# Patient Record
Sex: Female | Born: 1995
Health system: Southern US, Community
[De-identification: ages and names within clinical notes are randomized; demographics above are authoritative.]

## PROBLEM LIST (undated history)

## (undated) DIAGNOSIS — N912 Amenorrhea, unspecified: Secondary | ICD-10-CM

## (undated) HISTORY — DX: Amenorrhea, unspecified: N91.2

---

## 2017-01-27 ENCOUNTER — Telehealth: Payer: Self-pay | Admitting: Family Medicine

## 2017-01-27 ENCOUNTER — Encounter: Payer: Self-pay | Admitting: Family Medicine

## 2017-01-27 ENCOUNTER — Ambulatory Visit (INDEPENDENT_AMBULATORY_CARE_PROVIDER_SITE_OTHER): Payer: Medicaid Other | Admitting: Family Medicine

## 2017-01-27 VITALS — BP 124/86 | HR 82 | Temp 98.9°F | Resp 14 | Ht 65.0 in | Wt 203.0 lb

## 2017-01-27 DIAGNOSIS — Z23 Encounter for immunization: Secondary | ICD-10-CM

## 2017-01-27 DIAGNOSIS — Z7689 Persons encountering health services in other specified circumstances: Secondary | ICD-10-CM | POA: Diagnosis not present

## 2017-01-27 DIAGNOSIS — N912 Amenorrhea, unspecified: Secondary | ICD-10-CM | POA: Diagnosis not present

## 2017-01-27 DIAGNOSIS — Z131 Encounter for screening for diabetes mellitus: Secondary | ICD-10-CM | POA: Diagnosis not present

## 2017-01-27 DIAGNOSIS — Z1389 Encounter for screening for other disorder: Secondary | ICD-10-CM

## 2017-01-27 DIAGNOSIS — N83202 Unspecified ovarian cyst, left side: Secondary | ICD-10-CM | POA: Diagnosis not present

## 2017-01-27 LAB — POCT URINALYSIS DIP (DEVICE)
BILIRUBIN URINE: NEGATIVE
Glucose, UA: NEGATIVE mg/dL
HGB URINE DIPSTICK: NEGATIVE
KETONES UR: NEGATIVE mg/dL
Leukocytes, UA: NEGATIVE
NITRITE: NEGATIVE
PH: 6.5 (ref 5.0–8.0)
PROTEIN: NEGATIVE mg/dL
Specific Gravity, Urine: <=1.005 (ref 1.005–1.030)
Urobilinogen, UA: 0.2 mg/dL (ref 0.0–1.0)

## 2017-01-27 LAB — POCT URINE PREGNANCY: Preg Test, Ur: NEGATIVE

## 2017-01-27 NOTE — Progress Notes (Signed)
Patient ID: Myishia Kasik, female    DOB: Jan 29, 1996, 21 y.o.   MRN: [id number]  PCP: Bing Neighbors, FNP  Chief Complaint  Patient presents with  . Establish Care    Subjective:  HPI Svara Twyman is a 21 y.o. female presents to establish care. Tongela is concerned as she has experienced amenorrhea since May 2018.  She reports that recently her sister was diagnosed with polycystic ovarian syndrome she is also concerned that she may also suffer from this condition.  Denies any recent sexual contact.  She reports consistently taking her oral contraceptive pills for all the placebo week she is not producing any bleeding.  She denies any associated pelvic cramping, premenstrual symptoms, spotting.  She has no prior diagnoses of type 2 diabetes or hyperinsulinemia. Current Body mass index is 33.78 kg/m.   Social History   Social History  . Marital status: Single    Spouse name: N/A  . Number of children: N/A  . Years of education: N/A   Occupational History  . Not on file.   Social History Main Topics  . Smoking status: Never Smoker  . Smokeless tobacco: Never Used  . Alcohol use No  . Drug use: No  . Sexual activity: Not on file   Other Topics Concern  . Not on file   Social History Narrative  . No narrative on file   Family History  Problem Relation Age of Onset  . Diabetes Father    Review of Systems  Constitutional: Negative.   HENT: Negative.   Eyes: Negative.   Respiratory: Negative.   Cardiovascular: Negative.   Gastrointestinal: Negative.   Endocrine: Negative.   Genitourinary: Positive for menstrual problem. Negative for difficulty urinating, dysuria, pelvic pain, urgency, vaginal bleeding, vaginal discharge and vaginal pain.  Neurological: Negative.   Hematological: Negative.   Psychiatric/Behavioral: Negative.       Allergies not on file  Prior to Admission medications   Medication Sig Start Date End Date Taking? Authorizing Provider   Norethindrone Acetate-Ethinyl Estrad-FE (BLISOVI 24 FE) 1-20 MG-MCG(24) tablet Take 1 tablet by mouth daily.   Yes [provider]    Past Medical, Surgical Family and Social History reviewed and updated.    Objective:   Today's Vitals   01/27/17 0917  BP: 124/86  Pulse: 82  Resp: 14  Temp: 98.9 F (37.2 C)  TempSrc: Oral  SpO2: 99%  Weight: 203 lb (92.1 kg)  Height: 5\' 5"  (1.651 m)    Wt Readings from Last 3 Encounters:  01/27/17 203 lb (92.1 kg) (98 %, Z= 1.99)*   * Growth percentiles are based on CDC 2-20 Years data.    Physical Exam  Constitutional: She is oriented to person, place, and time. She appears well-developed and well-nourished.  HENT:  Head: Normocephalic and atraumatic.  Eyes: Pupils are equal, round, and reactive to light. Conjunctivae are normal.  Neck: Normal range of motion. Neck supple. No thyromegaly present.  Cardiovascular: Normal rate, regular rhythm, normal heart sounds and intact distal pulses.   Pulmonary/Chest: Effort normal and breath sounds normal.  Abdominal: Soft. Bowel sounds are normal. She exhibits no distension and no mass. There is no tenderness. There is no rebound and no guarding.  Musculoskeletal: Normal range of motion.  Lymphadenopathy:    She has no cervical adenopathy.  Neurological: She is alert and oriented to person, place, and time.  Skin: Skin is warm and dry.  Psychiatric: She has a normal mood and affect. Her behavior  is normal. Judgment and thought content normal.   Assessment & Plan:  1. Encounter to establish care 2. Amenorrhea, negative pregnancy. Checking prolactin, FSH/LH, testosterone, estradiol, and A1C.  If labs are normal, will consider intravaginal ultrasound and referral GYN. 3. Screening for blood or protein in urine, UA negative  4. Screening for diabetes mellitus- Hemoglobin A1c 5. Need for immunization against influenza- Flu Vaccine QUAD 36+ mos IM 6. Need for Tdap vaccination- Tdap vaccine  greater than or equal to 7yo IM   Orders Placed This Encounter  Procedures  . US Transvaginal Non-OB  . US Pelvis Complete  . Flu Vaccine QUAD 36+ mos IM  . Tdap vaccine greater than or equal to 7yo IM  . CBC with Differential  . Testosterone  . FSH/LH  . Prolactin  . Estradiol  . Hemoglobin A1c  . POCT urine pregnancy  . POCT urinalysis dip (device)    Return to care in 3 months.    Godfrey PickKimberly S. Tiburcio PeaHarris, MSN, FNP-C [facility]  [address] [place], [place] [address] [phone]

## 2017-01-27 NOTE — Telephone Encounter (Signed)
Carrie,  Please schedule non-ob transvaginal US and US pelvis complete.  Godfrey PickKimberly S. Tiburcio PeaHarris, MSN, FNP-C [facility]  [address] [place], [place] [address] [phone]

## 2017-01-28 LAB — CBC WITH DIFFERENTIAL/PLATELET
BASOS ABS: 41 {cells}/uL (ref 0–200)
Basophils Relative: 0.6 %
EOS ABS: 109 {cells}/uL (ref 15–500)
Eosinophils Relative: 1.6 %
HEMATOCRIT: 39.2 % (ref 35.0–45.0)
Hemoglobin: 13.3 g/dL (ref 11.7–15.5)
LYMPHS ABS: 2380 {cells}/uL (ref 850–3900)
MCH: 28.8 pg (ref 27.0–33.0)
MCHC: 33.9 g/dL (ref 32.0–36.0)
MCV: 84.8 fL (ref 80.0–100.0)
MPV: 9.8 fL (ref 7.5–12.5)
Monocytes Relative: 5.3 %
NEUTROS PCT: 57.5 %
Neutro Abs: 3910 cells/uL (ref 1500–7800)
Platelets: 249 10*3/uL (ref 140–400)
RBC: 4.62 10*6/uL (ref 3.80–5.10)
RDW: 12.4 % (ref 11.0–15.0)
TOTAL LYMPHOCYTE: 35 %
WBC: 6.8 10*3/uL (ref 3.8–10.8)
WBCMIX: 360 {cells}/uL (ref 200–950)

## 2017-01-28 LAB — ESTRADIOL: ESTRADIOL: 225 pg/mL

## 2017-01-28 LAB — FSH/LH
FSH: 2.9 m[IU]/mL
LH: 2.7 m[IU]/mL

## 2017-01-28 LAB — PROLACTIN: PROLACTIN: 25.1 ng/mL

## 2017-01-28 LAB — HEMOGLOBIN A1C
EAG (MMOL/L): 5.5 (calc)
HEMOGLOBIN A1C: 5.1 %{Hb} (ref <5.7)
MEAN PLASMA GLUCOSE: 100 (calc)

## 2017-01-28 LAB — TESTOSTERONE: Testosterone: 51 ng/dL

## 2017-01-30 ENCOUNTER — Encounter: Payer: Self-pay | Admitting: Family Medicine

## 2017-01-30 ENCOUNTER — Other Ambulatory Visit: Payer: Self-pay | Admitting: Family Medicine

## 2017-01-30 MED ORDER — NORETHIN ACE-ETH ESTRAD-FE 1-20 MG-MCG(24) PO TABS: 1.0000 | ORAL_TABLET | Freq: Every day | ORAL | 5 refills | Status: DC

## 2017-01-30 NOTE — Progress Notes (Signed)
Refilled oral contraceptive. Recent pregnancy test in office negative.  Godfrey PickKimberly S. Tiburcio PeaHarris, MSN, FNP-C [facility]  [address] [place], [place] [address] [phone]

## 2017-01-30 NOTE — Telephone Encounter (Signed)
Patient scheduled for 02/02/2017 at 1:15pm. Patient mom was notified of appointment and will make sure patient makes it.

## 2017-02-02 ENCOUNTER — Ambulatory Visit (HOSPITAL_COMMUNITY)
Admission: RE | Admit: 2017-02-02 | Discharge: 2017-02-02 | Disposition: A | Discharge status: Home / Self Care | Payer: Medicaid Other | Source: Ambulatory Visit | Attending: Family Medicine | Admitting: Family Medicine

## 2017-02-02 DIAGNOSIS — N83202 Unspecified ovarian cyst, left side: Secondary | ICD-10-CM | POA: Insufficient documentation

## 2017-02-02 DIAGNOSIS — N912 Amenorrhea, unspecified: Secondary | ICD-10-CM | POA: Diagnosis present

## 2017-02-03 ENCOUNTER — Encounter: Payer: Self-pay | Admitting: Family Medicine

## 2017-02-03 NOTE — Addendum Note (Signed)
Addended by: Bing NeighborsHARRIS, Kavonte Bearse S on: 02/03/2017 12:09 PM   Modules accepted: Orders

## 2017-02-07 ENCOUNTER — Encounter: Payer: Self-pay | Admitting: Family Medicine

## 2017-04-16 ENCOUNTER — Encounter: Payer: Self-pay | Admitting: Family Medicine

## 2017-05-01 ENCOUNTER — Encounter: Payer: Self-pay | Admitting: Family Medicine

## 2017-05-01 ENCOUNTER — Ambulatory Visit (INDEPENDENT_AMBULATORY_CARE_PROVIDER_SITE_OTHER): Payer: Medicaid Other | Admitting: Family Medicine

## 2017-05-01 ENCOUNTER — Ambulatory Visit: Payer: Medicaid Other | Admitting: Family Medicine

## 2017-05-01 VITALS — BP 122/80

## 2017-05-01 DIAGNOSIS — N912 Amenorrhea, unspecified: Secondary | ICD-10-CM | POA: Diagnosis not present

## 2017-05-01 LAB — POCT URINE PREGNANCY: PREG TEST UR: NEGATIVE

## 2017-05-22 ENCOUNTER — Ambulatory Visit: Payer: Medicaid Other | Admitting: Family Medicine

## 2017-05-22 ENCOUNTER — Encounter: Payer: Self-pay | Admitting: Family Medicine

## 2017-05-22 VITALS — BP 130/68 | HR 94 | Temp 98.9°F | Resp 16 | Ht 65.0 in | Wt 206.0 lb

## 2017-05-22 DIAGNOSIS — Z3200 Encounter for pregnancy test, result unknown: Secondary | ICD-10-CM | POA: Diagnosis not present

## 2017-05-22 DIAGNOSIS — N912 Amenorrhea, unspecified: Secondary | ICD-10-CM | POA: Diagnosis not present

## 2017-05-22 DIAGNOSIS — Z6836 Body mass index (BMI) 36.0-36.9, adult: Secondary | ICD-10-CM | POA: Diagnosis not present

## 2017-05-22 LAB — POCT URINALYSIS DIP (DEVICE)
BILIRUBIN URINE: NEGATIVE
Glucose, UA: NEGATIVE mg/dL
KETONES UR: NEGATIVE mg/dL
Leukocytes, UA: NEGATIVE
Nitrite: NEGATIVE
PH: 5.5 (ref 5.0–8.0)
Protein, ur: NEGATIVE mg/dL
Specific Gravity, Urine: 1.025 (ref 1.005–1.030)
Urobilinogen, UA: 0.2 mg/dL (ref 0.0–1.0)

## 2017-05-22 LAB — POCT URINE PREGNANCY: PREG TEST UR: NEGATIVE

## 2017-05-22 MED ORDER — NORETHIN ACE-ETH ESTRAD-FE 1-20 MG-MCG(24) PO TABS: 1.0000 | ORAL_TABLET | Freq: Every day | ORAL | 11 refills | Status: DC

## 2017-05-22 MED ORDER — NORETHIN ACE-ETH ESTRAD-FE 1-20 MG-MCG(24) PO TABS: 1.0000 | ORAL_TABLET | Freq: Every day | ORAL | 5 refills | Status: DC

## 2017-05-22 NOTE — Progress Notes (Signed)
Patient ID: Destiny Barrericka Grave, female    DOB: [birth year], 22 y.o.   MRN: [id number]  PCP: Bing NeighborsHarris, Rydell Wiegel S, FNP  Chief Complaint  Patient presents with  . Follow-up    3 month on rountine care    Subjective:  HPI Destiny Bowers is a 22 y.o. female with a history of obesity, amenorrhea, presents for follow-up of prior visits. During Gianelle last visit, she was concerned regarding the sudden cessation of her menstrual cycle. Pelvic and transvaginal ultrasound were both negative. She was referred to Dr. Derrek Guouglas Louk for a second opinion at [facility], in which he felt amenorrhea was secondary to oral contraceptive medication, no additional work-up was warranted. She continues not to have regular menses and recent pregnancy test was negative. She is continuing to make efforts to loose weight by engaging in routine physical activity. Current Body mass index is 34.28 kg/m. Social History   Socioeconomic History  . Marital status: Single    Spouse name: Not on file  . Number of children: Not on file  . Years of education: Not on file  . Highest education level: Not on file  Social Needs  . Financial resource strain: Not on file  . Food insecurity - worry: Not on file  . Food insecurity - inability: Not on file  . Transportation needs - medical: Not on file  . Transportation needs - non-medical: Not on file  Occupational History  . Not on file  Tobacco Use  . Smoking status: Never Smoker  . Smokeless tobacco: Never Used  Substance and Sexual Activity  . Alcohol use: No  . Drug use: No  . Sexual activity: Not on file  Other Topics Concern  . Not on file  Social History Narrative  . Not on file    Family History  Problem Relation Age of Onset  . Diabetes Father    Review of Systems Pertinent  negatives indicated in HPI Patient Active Problem List   Diagnosis Date Noted  . Amenorrhea 01/27/2017    Allergies not on file  Prior to Admission medications    Medication Sig Start Date End Date Taking? Authorizing Provider  Norethindrone Acetate-Ethinyl Estrad-FE (BLISOVI 24 FE) 1-20 MG-MCG(24) tablet Take 1 tablet by mouth daily. 01/30/17   Bing NeighborsHarris, Arlethia Basso S, FNP    Past Medical, Surgical Family and Social History reviewed and updated.    Objective:   Today's Vitals   05/22/17 1039  BP: 130/68  Pulse: 94  Resp: 16  Temp: 98.9 F (37.2 C)  TempSrc: Oral  SpO2: 100%  Weight: 206 lb (93.4 kg)  Height: 5\' 5"  (1.651 m)    Wt Readings from Last 3 Encounters:  05/22/17 206 lb (93.4 kg) (98 %, Z= 2.03)*  01/27/17 203 lb (92.1 kg) (98 %, Z= 1.99)*   * Growth percentiles are based on CDC (Girls, 2-20 Years) data.    Physical Exam Constitutional: Patient appears well-developed and well-nourished. No distress. HENT: Normocephalic, atraumatic, External right and left ear normal. Oropharynx is clear and moist.  Eyes: Conjunctivae and EOM are normal. PERRLA, no scleral icterus. Neck: Normal ROM. Neck supple. No JVD. No tracheal deviation. No thyromegaly. CVS: RRR, S1/S2 +, no murmurs, no gallops, no carotid bruit.  Pulmonary: Effort and breath sounds normal, no stridor, rhonchi, wheezes, rales.  Abdominal: Soft. BS +, no distension, tenderness, rebound or guarding.  Musculoskeletal: Normal range of motion. No edema and no tenderness.  Lymphadenopathy: No lymphadenopathy noted, cervical, inguinal or axillary Neuro: Alert.  Normal reflexes, muscle tone coordination. No cranial nerve deficit. Skin: Skin is warm and dry. No rash noted. Not diaphoretic. No erythema. No pallor. Psychiatric: Normal mood and affect. Behavior, judgment, thought content normal.   Assessment & Plan:  1. Amenorrhea, likely secondary to chronic oral contraceptive use. She has followed-up with GYN, who recommends continue oral contraception and to follow-up as needed.  2. Encounter for pregnancy test, result unknown, negative   3. Body mass index (BMI) of 36.0-36.9 in  adult Goal BMI  <25. Encouraged efforts to reduce weight include engaging in physical activity as tolerated with goal of 150 minutes per week. Improve dietary choices and eat a meal regimen consistent with a Mediterranean or DASH diet. Reduce simple carbohydrates. Do not skip meals and eat healthy snacks throughout the day to avoid over-eating at dinner. Set a goal weight loss that is achievable for you.  Meds ordered this encounter  Medications  . DISCONTD: Norethindrone Acetate-Ethinyl Estrad-FE (BLISOVI 24 FE) 1-20 MG-MCG(24) tablet    Sig: Take 1 tablet by mouth daily.    Dispense:  1 Package    Refill:  5    Order Specific Question:   Supervising Provider    Answer:   Quentin Angst [id number]  . Norethindrone Acetate-Ethinyl Estrad-FE (BLISOVI 24 FE) 1-20 MG-MCG(24) tablet    Sig: Take 1 tablet by mouth daily.    Dispense:  1 Package    Refill:  11    Order Specific Question:   Supervising Provider    Answer:   Quentin Angst [id number]    Orders Placed This Encounter  Procedures  . POCT urinalysis dip (device)  . POCT urine pregnancy    RTC: 1 year for annual wellness exam.   Godfrey Pick. Tiburcio Pea, MSN, FNP-C [facility]  [address] [place], [place] [address] [phone]

## 2017-05-31 DIAGNOSIS — Z6836 Body mass index (BMI) 36.0-36.9, adult: Secondary | ICD-10-CM | POA: Insufficient documentation

## 2017-12-14 ENCOUNTER — Ambulatory Visit (INDEPENDENT_AMBULATORY_CARE_PROVIDER_SITE_OTHER): Payer: No Typology Code available for payment source

## 2017-12-14 DIAGNOSIS — Z23 Encounter for immunization: Secondary | ICD-10-CM

## 2017-12-18 IMAGING — US US TRANSVAGINAL NON-OB
1 series · 13 of 25 positions shown · non-contrast
Comparison: None

CLINICAL DATA: Initial evaluation for amenorrhea.

EXAM:
TRANSABDOMINAL AND TRANSVAGINAL ULTRASOUND OF PELVIS
TECHNIQUE: Both transabdominal and transvaginal ultrasound examinations of the
pelvis were performed. Transabdominal technique was performed for
global imaging of the pelvis including uterus, ovaries, adnexal
regions, and pelvic cul-de-sac. It was necessary to proceed with
endovaginal exam following the transabdominal exam to visualize the
uterus and ovaries.

[Series 1: us transvaginal non-ob · 0.22mm/px · 13 of 98 slices shown]
[im 1/98]
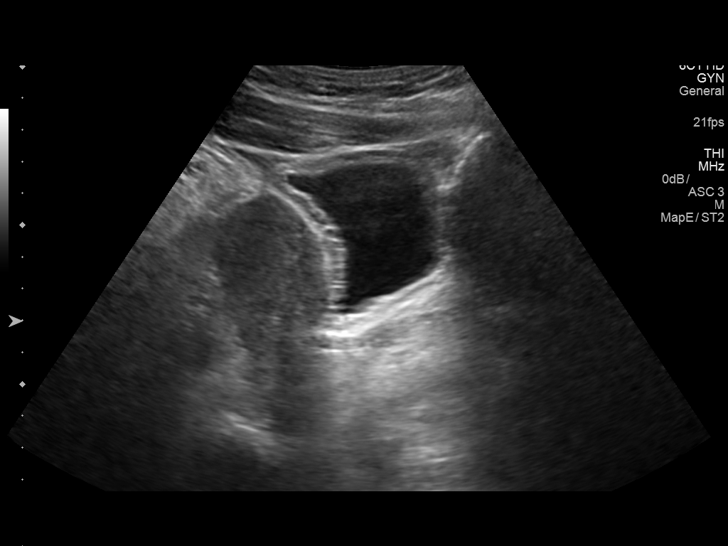
[im 9/98]
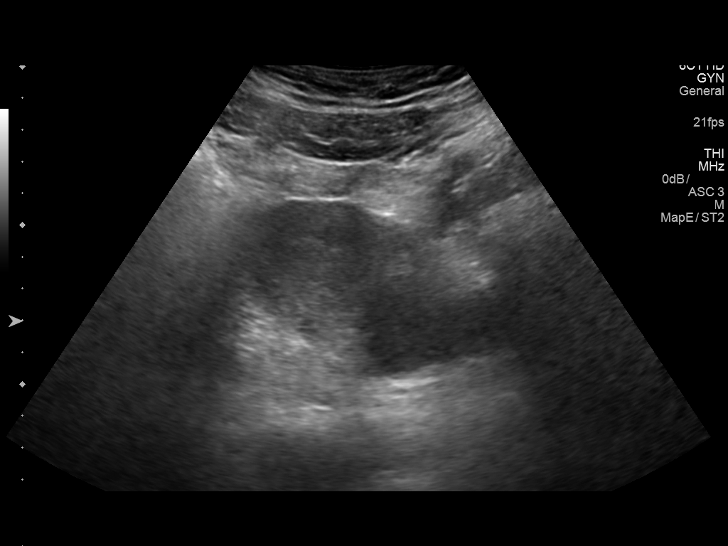
[im 17/98]
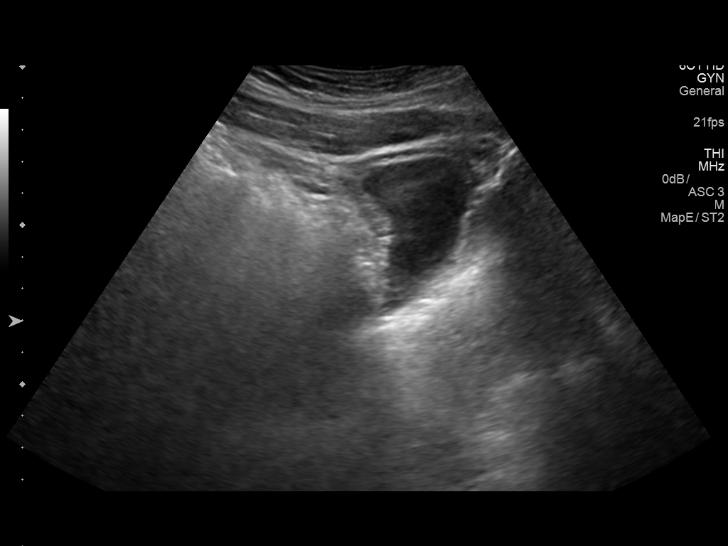
[im 25/98]
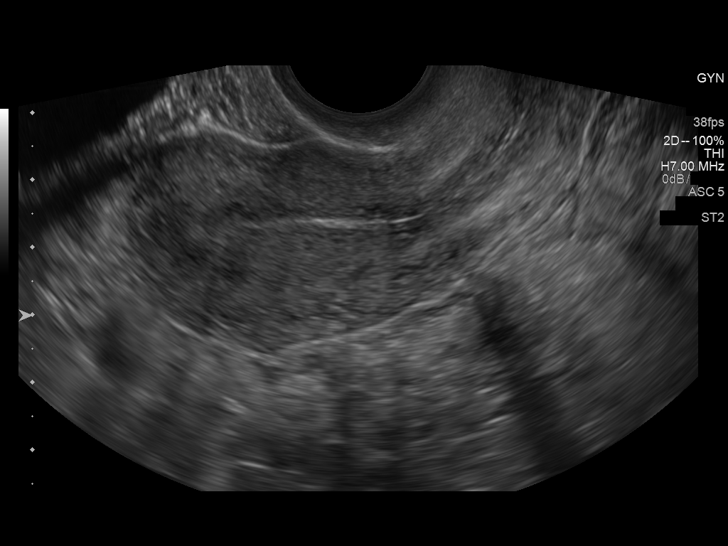
[im 33/98]
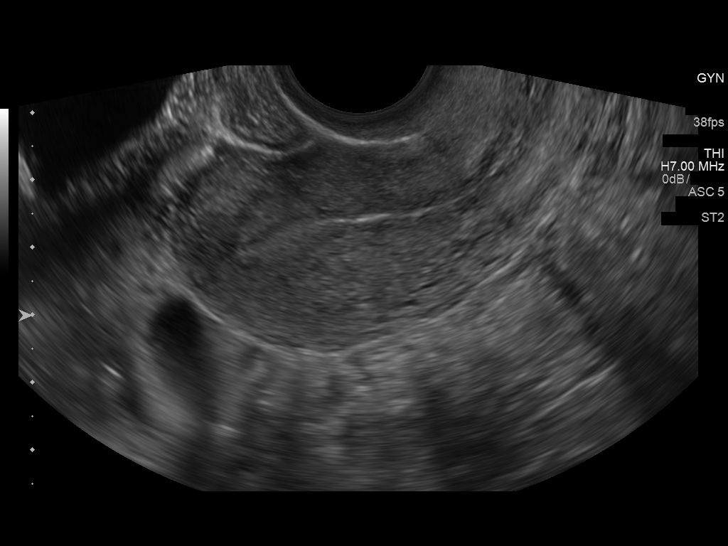
[im 41/98]
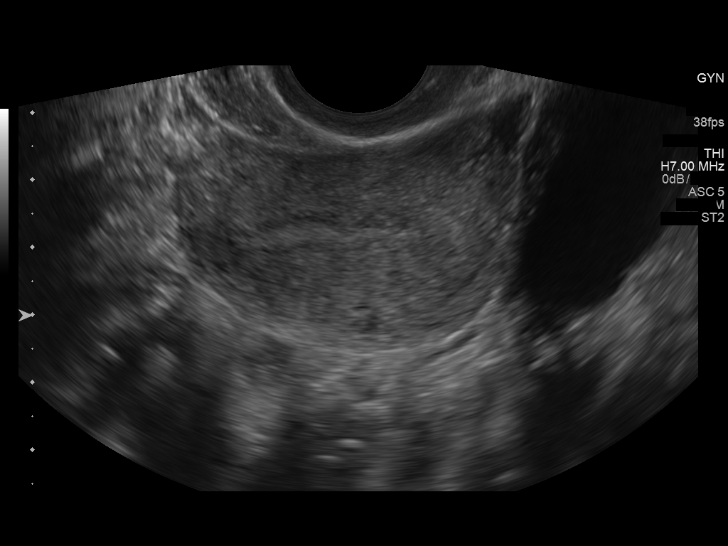
[im 49/98]
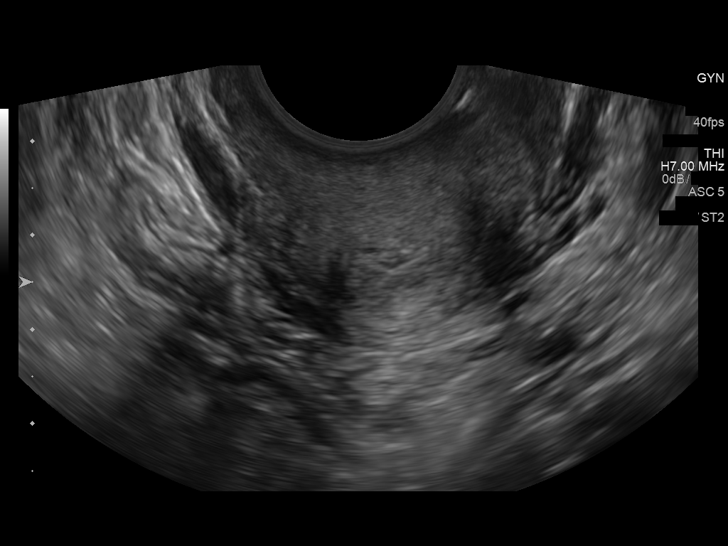
[im 57/98]
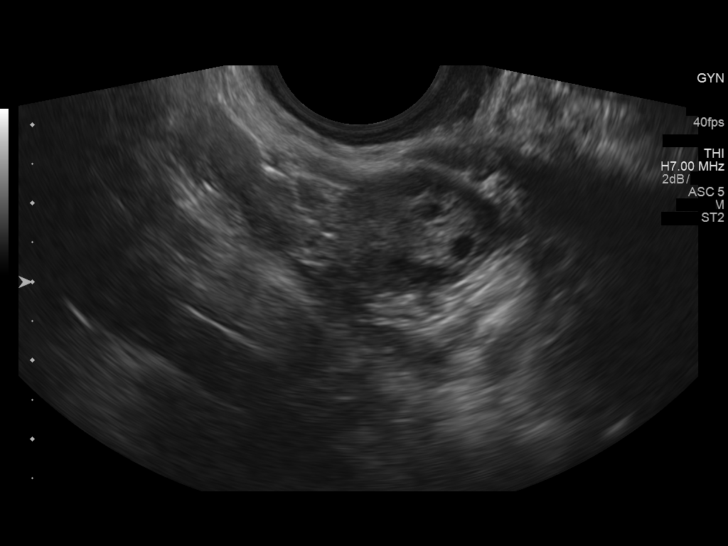
[im 65/98]
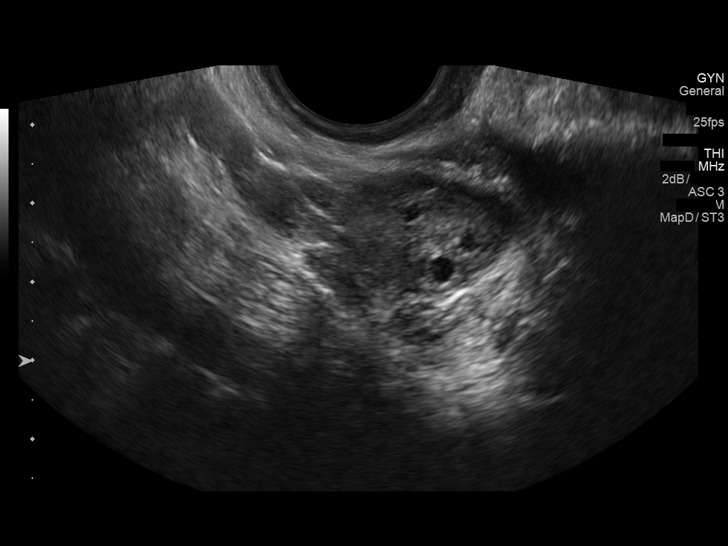
[im 73/98]
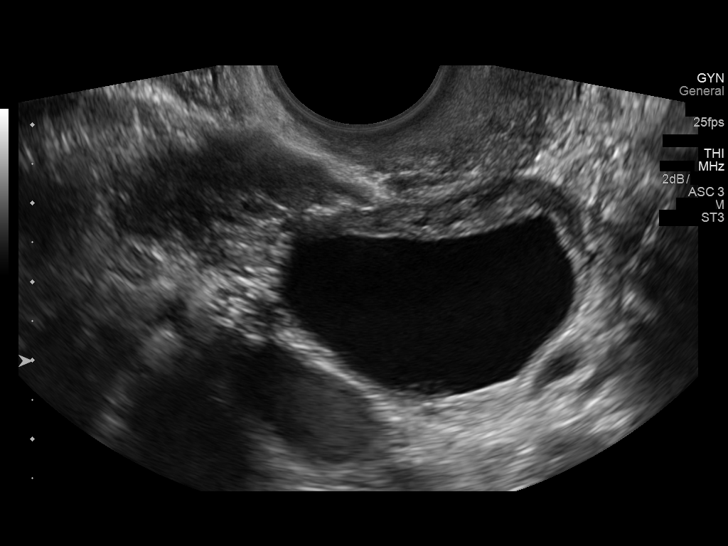
[im 81/98]
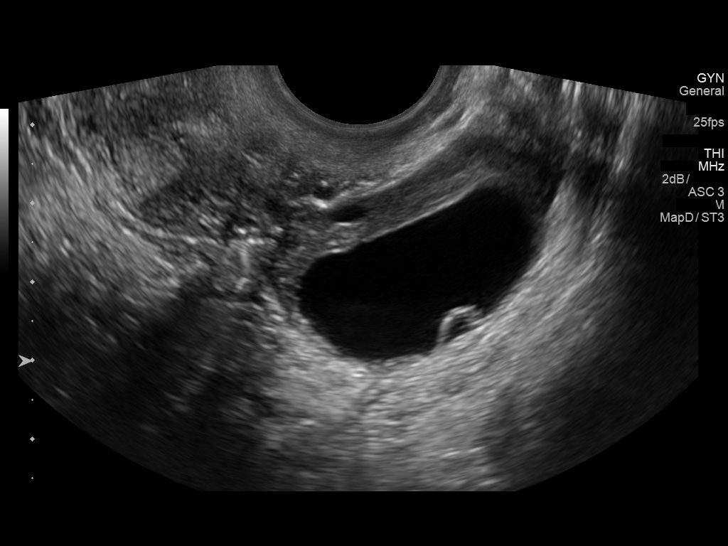
[im 89/98]
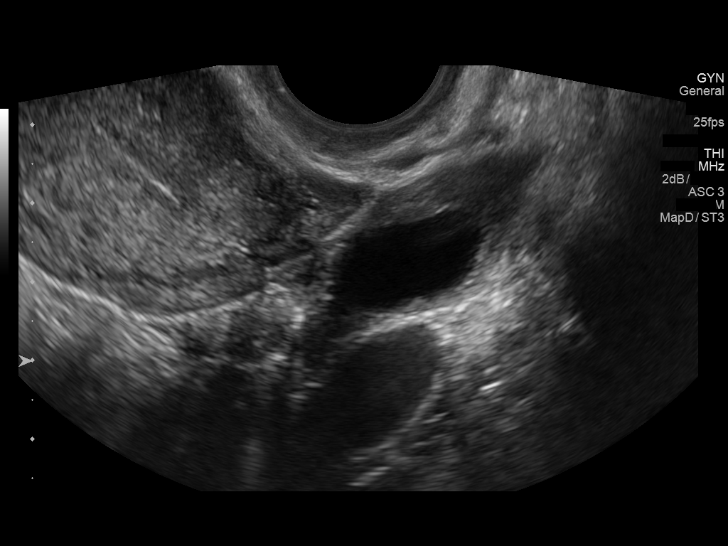
[im 98/98]
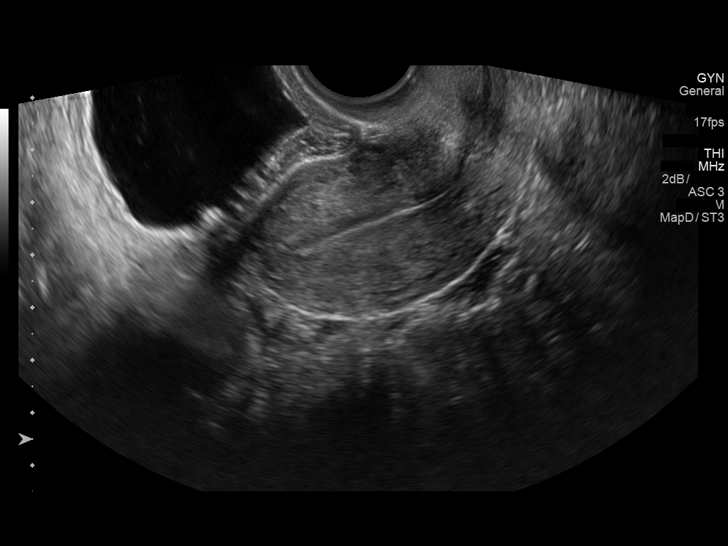

[13 of 25 positions shown; findings below may reference images not displayed]

FINDINGS: Uterus

Measurements: 7.4 x 3.4 x 4.6 cm. No fibroids or other mass
visualized.

Endometrium

Thickness: 3.1 mm.  No focal abnormality visualized.

Right ovary

Measurements: 2.2 x 1.8 x 1.6 cm. Normal appearance/no adnexal mass.

Left ovary

Measurements: 4.4 x 3.0 x 2.4 cm. 3.7 x 2.0 x 2.2 cm simple anechoic
cyst, most consistent with a normal physiologic cyst. Probable small
internal daughter cysts noted. No complex or worrisome features
identified.

Other findings

No abnormal free fluid.
IMPRESSION: 1. 3.7 cm left ovarian cyst, most consistent with a normal
physiologic cyst. Based on age and appearance of this lesion, no
follow-up imaging is recommended regarding this finding.
2. Otherwise unremarkable pelvic ultrasound.

## 2018-06-27 ENCOUNTER — Encounter: Payer: Self-pay | Admitting: Family Medicine

## 2018-07-03 ENCOUNTER — Telehealth: Payer: Self-pay | Admitting: Family Medicine

## 2018-07-03 NOTE — Telephone Encounter (Signed)
Copied from CRM [id number]. Topic: General - Other >> Jul 03, 2018  9:42 AM Leafy Ro wrote: Reason for CRM: [facility] left voice message on refill line. Pt needs a refill on blisovi 24 FE.  [facility] [place]

## 2018-07-03 NOTE — Telephone Encounter (Signed)
Not our pt call pt and schedule an appointment.

## 2018-07-04 ENCOUNTER — Telehealth: Payer: Self-pay | Admitting: Family Medicine

## 2018-07-04 NOTE — Telephone Encounter (Signed)
Patient scheduled for follow up appointment at our office on 06/26/2018 for Telephone Virtual Appointment.

## 2018-07-06 ENCOUNTER — Encounter: Payer: Self-pay | Admitting: Family Medicine

## 2018-07-06 ENCOUNTER — Ambulatory Visit (INDEPENDENT_AMBULATORY_CARE_PROVIDER_SITE_OTHER): Payer: No Typology Code available for payment source | Admitting: Family Medicine

## 2018-07-06 ENCOUNTER — Ambulatory Visit: Payer: No Typology Code available for payment source | Admitting: Family Medicine

## 2018-07-06 ENCOUNTER — Other Ambulatory Visit: Payer: Self-pay

## 2018-07-06 DIAGNOSIS — N912 Amenorrhea, unspecified: Secondary | ICD-10-CM | POA: Diagnosis not present

## 2018-07-06 DIAGNOSIS — Z309 Encounter for contraceptive management, unspecified: Secondary | ICD-10-CM | POA: Insufficient documentation

## 2018-07-06 MED ORDER — NORETHIN ACE-ETH ESTRAD-FE 1-20 MG-MCG(24) PO TABS: 1.0000 | ORAL_TABLET | Freq: Every day | ORAL | 3 refills | Status: DC

## 2018-07-06 NOTE — Progress Notes (Signed)
Virtual Visit via Telephone Note--Re-Establish Care I connected with Destiny Bowers on 07/06/18 at  8:00 AM EDT by telephone and verified that I am speaking with the correct person using two identifiers.   I discussed the limitations, risks, security and privacy concerns of performing an evaluation and management service by telephone and the availability of in person appointments. I also discussed with the patient that there may be a patient responsible charge related to this service. The patient expressed understanding and agreed to proceed.  History of Present Illness:  Past Medical History:  Diagnosis Date  . Amenorrhea     No outpatient medications have been marked as taking for the 07/06/18 encounter (Office Visit) with Kallie Locks, FNP.   Current Status: Since her last office visit, she is doing well with no complaints. She has been lost to follow up. Her last Pap Smear was 1 year ago. She has followed up with GYN for Amenorrhea, with break through bleeding. She continues birth control regularly.    She denies fevers, chills, fatigue, recent infections, weight loss, and night sweats. She has not had any headaches, visual changes, dizziness, and falls. No chest pain, heart palpitations, cough and shortness of breath reported. No reports of GI problems such as nausea, vomiting, diarrhea, and constipation. She has no reports of blood in stools, dysuria and hematuria. No depression or anxiety reported. She denies pain today.   Past Medical History:  Diagnosis Date  . Amenorrhea      Observations/Objective:  Telephone Virtual Visit  Assessment and Plan:  1. Encounter for contraceptive management, unspecified type - Norethindrone Acetate-Ethinyl Estrad-FE (BLISOVI 24 FE) 1-20 MG-MCG(24) tablet; Take 1 tablet by mouth daily.  Dispense: 1 Package; Refill: 3  2. Amenorrhea She continues to have mild periods.    Follow Up Instructions: She will follow up within the next 3 months  for Annual Physical.  Meds ordered this encounter  Medications  . Norethindrone Acetate-Ethinyl Estrad-FE (BLISOVI 24 FE) 1-20 MG-MCG(24) tablet    Sig: Take 1 tablet by mouth daily.    Dispense:  1 Package    Refill:  3    No orders of the defined types were placed in this encounter.  Referral Orders  No referral(s) requested today    Raliegh Ip,  MSN, FNP-C [facility] [address] [place], [place] [address] [phone]  I discussed the assessment and treatment plan with the patient. The patient was provided an opportunity to ask questions and all were answered. The patient agreed with the plan and demonstrated an understanding of the instructions.   The patient was advised to call back or seek an in-person evaluation if the symptoms worsen or if the condition fails to improve as anticipated.  I provided 15-20 minutes of non-face-to-face time during this encounter.   Kallie Locks, FNP

## 2018-07-10 ENCOUNTER — Ambulatory Visit: Payer: No Typology Code available for payment source | Admitting: Family Medicine

## 2018-08-13 ENCOUNTER — Ambulatory Visit: Payer: No Typology Code available for payment source | Admitting: Family Medicine

## 2018-09-19 ENCOUNTER — Other Ambulatory Visit: Payer: Self-pay | Admitting: Family Medicine

## 2018-09-19 DIAGNOSIS — Z309 Encounter for contraceptive management, unspecified: Secondary | ICD-10-CM

## 2018-09-26 ENCOUNTER — Other Ambulatory Visit: Payer: Self-pay

## 2018-09-26 DIAGNOSIS — Z309 Encounter for contraceptive management, unspecified: Secondary | ICD-10-CM

## 2018-09-26 MED ORDER — NORETHIN ACE-ETH ESTRAD-FE 1-20 MG-MCG(24) PO TABS: 1.0000 | ORAL_TABLET | Freq: Every day | ORAL | 3 refills | Status: DC

## 2018-09-26 NOTE — Telephone Encounter (Signed)
Medication sent to pharmacy  

## 2018-10-08 ENCOUNTER — Ambulatory Visit (INDEPENDENT_AMBULATORY_CARE_PROVIDER_SITE_OTHER): Payer: No Typology Code available for payment source | Admitting: Family Medicine

## 2018-10-08 ENCOUNTER — Encounter: Payer: Self-pay | Admitting: Family Medicine

## 2018-10-08 ENCOUNTER — Other Ambulatory Visit: Payer: Self-pay

## 2018-10-08 VITALS — BP 130/82 | HR 95 | Temp 97.9°F | Ht 65.0 in | Wt 229.0 lb

## 2018-10-08 DIAGNOSIS — Z0000 Encounter for general adult medical examination without abnormal findings: Secondary | ICD-10-CM | POA: Diagnosis not present

## 2018-10-08 DIAGNOSIS — Z789 Other specified health status: Secondary | ICD-10-CM

## 2018-10-08 DIAGNOSIS — Z09 Encounter for follow-up examination after completed treatment for conditions other than malignant neoplasm: Secondary | ICD-10-CM | POA: Diagnosis not present

## 2018-10-08 DIAGNOSIS — N912 Amenorrhea, unspecified: Secondary | ICD-10-CM

## 2018-10-08 LAB — POCT URINALYSIS DIP (MANUAL ENTRY)
Bilirubin, UA: NEGATIVE
Blood, UA: NEGATIVE
Glucose, UA: NEGATIVE mg/dL
Ketones, POC UA: NEGATIVE mg/dL
Leukocytes, UA: NEGATIVE
Nitrite, UA: NEGATIVE
Protein Ur, POC: NEGATIVE mg/dL
Spec Grav, UA: 1.02 (ref 1.010–1.025)
Urobilinogen, UA: 0.2 E.U./dL
pH, UA: 6.5 (ref 5.0–8.0)

## 2018-10-08 LAB — POCT URINE PREGNANCY: Preg Test, Ur: NEGATIVE

## 2018-10-08 NOTE — Patient Instructions (Addendum)

## 2018-10-08 NOTE — Progress Notes (Signed)
[facility]   Annual Physical  Subjective:  Patient ID: Destiny Bowers, female    DOB: May 25, 1995  Age: 23 y.o. MRN: [id number]  CC:  Chief Complaint  Patient presents with  . Annual Exam    HPI Destiny Bowers is a 23 year old female who presents for her Annual Physical today.   Past Medical History:  Diagnosis Date  . Amenorrhea    Current Status: Since her last office visit, she is doing well with no complaints. She is currently working full time. She gets 30 minutes of cardio daily. She has a history of Amenorrhea. She has been taking oral birth control X 2 years now. She was previously followed by GYN. She does not do self-breast exams.   She denies fevers, chills, fatigue, recent infections, weight loss, and night sweats. She has not had any headaches, visual changes, dizziness, and falls. No chest pain, heart palpitations, cough and shortness of breath reported. No reports of GI problems such as nausea, vomiting, diarrhea, and constipation. She has no reports of blood in stools, dysuria and hematuria. No depression or anxiety today. She denies pain today.   History reviewed. No pertinent surgical history.  Family History  Problem Relation Age of Onset  . Diabetes Father     Social History   Socioeconomic History  . Marital status: Single    Spouse name: Not on file  . Number of children: Not on file  . Years of education: Not on file  . Highest education level: Not on file  Occupational History  . Not on file  Social Needs  . Financial resource strain: Not on file  . Food insecurity    Worry: Not on file    Inability: Not on file  . Transportation needs    Medical: Not on file    Non-medical: Not on file  Tobacco Use  . Smoking status: Never Smoker  . Smokeless tobacco: Never Used  Substance and Sexual Activity  . Alcohol use: No  . Drug use: No  . Sexual activity: Yes    Birth control/protection: Condom,  Pill  Lifestyle  . Physical activity    Days per week: Not on file    Minutes per session: Not on file  . Stress: Not on file  Relationships  . Social Herbalist on phone: Not on file    Gets together: Not on file    Attends religious service: Not on file    Active member of club or organization: Not on file    Attends meetings of clubs or organizations: Not on file    Relationship status: Not on file  . Intimate partner violence    Fear of current or ex partner: Not on file    Emotionally abused: Not on file    Physically abused: Not on file    Forced sexual activity: Not on file  Other Topics Concern  . Not on file  Social History Narrative  . Not on file    Outpatient Medications Prior to Visit  Medication Sig Dispense Refill  . Norethindrone Acetate-Ethinyl Estrad-FE (BLISOVI 24 FE) 1-20 MG-MCG(24) tablet Take 1 tablet by mouth daily. 1 Package 3   No facility-administered medications prior to visit.     No Known Allergies  ROS Review of Systems  Constitutional: Negative.   HENT: Negative.   Eyes: Negative.   Respiratory: Negative.   Cardiovascular: Negative.   Gastrointestinal: Negative.  Endocrine: Negative.   Genitourinary: Positive for menstrual problem (amenorrhea).  Musculoskeletal: Negative.   Allergic/Immunologic: Negative.   Neurological: Negative.   Hematological: Negative.   Psychiatric/Behavioral: Negative.       Objective:    Physical Exam  Constitutional: She is oriented to person, place, and time. She appears well-developed and well-nourished.  HENT:  Head: Normocephalic and atraumatic.  Eyes: Conjunctivae are normal.  Neck: Normal range of motion. Neck supple.  Cardiovascular: Normal rate, regular rhythm, normal heart sounds and intact distal pulses.  Pulmonary/Chest: Effort normal and breath sounds normal.  Abdominal: Soft. Bowel sounds are normal.  Musculoskeletal: Normal range of motion.  Neurological: She is alert and  oriented to person, place, and time. She has normal reflexes.  Skin: Skin is warm and dry.  Psychiatric: She has a normal mood and affect. Her behavior is normal. Judgment and thought content normal.  Nursing note and vitals reviewed.   BP 130/82 (BP Location: Left Arm, Patient Position: Sitting, Cuff Size: Large)   Pulse 95   Temp 97.9 F (36.6 C) (Oral)   Ht 5' 5" (1.651 m)   Wt 229 lb (103.9 kg)   SpO2 99%   BMI 38.11 kg/m  Wt Readings from Last 3 Encounters:  10/08/18 229 lb (103.9 kg)  05/22/17 206 lb (93.4 kg) (98 %, Z= 2.03)*  01/27/17 203 lb (92.1 kg) (98 %, Z= 1.99)*   * Growth percentiles are based on CDC (Girls, 2-20 Years) data.     Health Maintenance Due  Topic Date Due  . CHLAMYDIA SCREENING  11/01/2012  . HIV Screening  11/01/2012    There are no preventive care reminders to display for this patient.  No results found for: TSH Lab Results  Component Value Date   WBC 6.8 01/27/2017   HGB 13.3 01/27/2017   HCT 39.2 01/27/2017   MCV 84.8 01/27/2017   PLT 249 01/27/2017   No results found for: NA, K, CHLORIDE, CO2, GLUCOSE, BUN, CREATININE, BILITOT, ALKPHOS, AST, ALT, PROT, ALBUMIN, CALCIUM, ANIONGAP, EGFR, GFR No results found for: CHOL No results found for: HDL No results found for: LDLCALC No results found for: TRIG No results found for: [facility] Lab Results  Component Value Date   HGBA1C 5.1 01/27/2017      Assessment & Plan:   1. Physical exam Stable exam. She is doing well. She will continue to decrease high sodium intake, excessive alcohol intake, increase potassium intake, smoking cessation, and increase physical activity of at least 30 minutes of cardio activity daily. She will continue to follow Heart Healthy or DASH diet. - POCT urinalysis dipstick - HepB+HepC+HIV Panel - STD Screen (8) - CBC with Differential - Comprehensive metabolic panel - POCT urine pregnancy  2. Annual physical exam  3. Amenorrhea - POCT urine pregnancy   4. Uses birth control Birth Control Pill.   5. Healthcare maintenance - HepB+HepC+HIV Panel - STD Screen (8) - CBC with Differential - Comprehensive metabolic panel - POCT urine pregnancy  6. Follow up She will follow up in 1 year.   No orders of the defined types were placed in this encounter.   Orders Placed This Encounter  Procedures  . HepB+HepC+HIV Panel  . STD Screen (8)  . CBC with Differential  . Comprehensive metabolic panel  . POCT urinalysis dipstick  . POCT urine pregnancy    Referral Orders  No referral(s) requested today    Kathe Becton,  MSN, FNP-BC [facility]  [place], [place] [phone]  Problem List Items Addressed This Visit      Other   Amenorrhea   Relevant Orders   POCT urine pregnancy    Other Visit Diagnoses    Physical exam    -  Primary   Relevant Orders   POCT urinalysis dipstick (Completed)   HepB+HepC+HIV Panel   STD Screen (8)   CBC with Differential   Comprehensive metabolic panel   POCT urine pregnancy   Annual physical exam       Uses birth control       Healthcare maintenance       Relevant Orders   HepB+HepC+HIV Panel   STD Screen (8)   CBC with Differential   Comprehensive metabolic panel   POCT urine pregnancy   Follow up          No orders of the defined types were placed in this encounter.   Follow-up: Return in about 1 year (around 10/08/2019).    Azzie Glatter, FNP

## 2018-10-10 LAB — HEPB+HEPC+HIV PANEL
HIV Screen 4th Generation wRfx: NONREACTIVE
Hep B C IgM: NEGATIVE
Hep B Core Total Ab: NEGATIVE
Hep B E Ab: NEGATIVE
Hep B E Ag: NEGATIVE
Hep B Surface Ab, Qual: NONREACTIVE
Hep C Virus Ab: <0.1 s/co ratio (ref 0.0–0.9)
Hepatitis B Surface Ag: NEGATIVE

## 2018-10-10 LAB — CBC WITH DIFFERENTIAL/PLATELET
Basophils Absolute: 0 10*3/uL (ref 0.0–0.2)
Basos: 0 %
EOS (ABSOLUTE): 0.2 10*3/uL (ref 0.0–0.4)
Eos: 2 %
Hematocrit: 38.9 % (ref 34.0–46.6)
Hemoglobin: 13 g/dL (ref 11.1–15.9)
Immature Grans (Abs): 0 10*3/uL (ref 0.0–0.1)
Immature Granulocytes: 0 %
Lymphocytes Absolute: 2.8 10*3/uL (ref 0.7–3.1)
Lymphs: 38 %
MCH: 28.6 pg (ref 26.6–33.0)
MCHC: 33.4 g/dL (ref 31.5–35.7)
MCV: 86 fL (ref 79–97)
Monocytes Absolute: 0.4 10*3/uL (ref 0.1–0.9)
Monocytes: 5 %
Neutrophils Absolute: 4.2 10*3/uL (ref 1.4–7.0)
Neutrophils: 55 %
Platelets: 261 10*3/uL (ref 150–450)
RBC: 4.54 x10E6/uL (ref 3.77–5.28)
RDW: 12.6 % (ref 11.7–15.4)
WBC: 7.5 10*3/uL (ref 3.4–10.8)

## 2018-10-10 LAB — COMPREHENSIVE METABOLIC PANEL
ALT: 9 IU/L (ref 0–32)
AST: 14 IU/L (ref 0–40)
Albumin/Globulin Ratio: 1.9 (ref 1.2–2.2)
Albumin: 4.4 g/dL (ref 3.9–5.0)
Alkaline Phosphatase: 73 IU/L (ref 39–117)
BUN/Creatinine Ratio: 10 (ref 9–23)
BUN: 9 mg/dL (ref 6–20)
Bilirubin Total: 0.4 mg/dL (ref 0.0–1.2)
CO2: 22 mmol/L (ref 20–29)
Calcium: 9.3 mg/dL (ref 8.7–10.2)
Chloride: 103 mmol/L (ref 96–106)
Creatinine, Ser: 0.9 mg/dL (ref 0.57–1.00)
GFR calc Af Amer: 106 mL/min/{1.73_m2} (ref >59)
GFR calc non Af Amer: 92 mL/min/{1.73_m2} (ref >59)
Globulin, Total: 2.3 g/dL (ref 1.5–4.5)
Glucose: 107 mg/dL — ABNORMAL HIGH (ref 65–99)
Potassium: 4.3 mmol/L (ref 3.5–5.2)
Sodium: 138 mmol/L (ref 134–144)
Total Protein: 6.7 g/dL (ref 6.0–8.5)

## 2018-10-10 LAB — STD SCREEN (8)
HSV 1 Glycoprotein G Ab, IgG: 1.2 index — ABNORMAL HIGH (ref 0.00–0.90)
HSV 2 IgG, Type Spec: <0.91 index (ref 0.00–0.90)
Hep A IgM: NEGATIVE
RPR Ser Ql: NONREACTIVE

## 2018-12-14 ENCOUNTER — Other Ambulatory Visit: Payer: Self-pay | Admitting: Family Medicine

## 2018-12-14 DIAGNOSIS — Z309 Encounter for contraceptive management, unspecified: Secondary | ICD-10-CM

## 2019-05-24 ENCOUNTER — Other Ambulatory Visit: Payer: Self-pay | Admitting: Family Medicine

## 2019-05-24 DIAGNOSIS — Z309 Encounter for contraceptive management, unspecified: Secondary | ICD-10-CM

## 2019-10-29 ENCOUNTER — Other Ambulatory Visit: Payer: Self-pay | Admitting: Family Medicine

## 2019-10-29 DIAGNOSIS — Z309 Encounter for contraceptive management, unspecified: Secondary | ICD-10-CM

## 2019-12-02 ENCOUNTER — Encounter: Payer: Self-pay | Admitting: Family Medicine

## 2019-12-02 ENCOUNTER — Other Ambulatory Visit: Payer: Self-pay | Admitting: Family Medicine

## 2019-12-02 ENCOUNTER — Other Ambulatory Visit: Payer: Self-pay

## 2019-12-02 ENCOUNTER — Ambulatory Visit (INDEPENDENT_AMBULATORY_CARE_PROVIDER_SITE_OTHER): Payer: No Typology Code available for payment source | Admitting: Family Medicine

## 2019-12-02 VITALS — BP 108/93 | HR 100 | Temp 97.8°F | Resp 18 | Ht 64.0 in | Wt 242.6 lb

## 2019-12-02 DIAGNOSIS — N912 Amenorrhea, unspecified: Secondary | ICD-10-CM | POA: Diagnosis not present

## 2019-12-02 DIAGNOSIS — Z09 Encounter for follow-up examination after completed treatment for conditions other than malignant neoplasm: Secondary | ICD-10-CM | POA: Diagnosis not present

## 2019-12-02 DIAGNOSIS — Z0000 Encounter for general adult medical examination without abnormal findings: Secondary | ICD-10-CM | POA: Diagnosis not present

## 2019-12-02 DIAGNOSIS — Z309 Encounter for contraceptive management, unspecified: Secondary | ICD-10-CM

## 2019-12-02 LAB — POCT URINALYSIS DIPSTICK
Bilirubin, UA: NEGATIVE
Glucose, UA: NEGATIVE
Ketones, UA: NEGATIVE
Leukocytes, UA: NEGATIVE
Nitrite, UA: NEGATIVE
Protein, UA: NEGATIVE
Spec Grav, UA: >=1.03 — AB (ref 1.010–1.025)
Urobilinogen, UA: 1 E.U./dL
pH, UA: 5.5 (ref 5.0–8.0)

## 2019-12-02 NOTE — Progress Notes (Signed)
[facility]   Annual Physical  Subjective:  Patient ID: Destiny Bowers, female    DOB: 1995/08/29  Age: 24 y.o. MRN: [id number]  CC:  Chief Complaint  Patient presents with  . Annual Exam    Pt states no question or concerns at this moment.    HPI Destiny Bowers is a 24 year old female who presents for her Annual Physical today.    Patient Active Problem List   Diagnosis Date Noted  . Encounter for contraceptive management 07/06/2018  . Body mass index (BMI) of 36.0-36.9 in adult 05/31/2017  . Amenorrhea 01/27/2017    Past Medical History:  Diagnosis Date  . Amenorrhea    Current Status: Since her last office visit, she is doing well with no complaints. She denies fevers, chills, fatigue, recent infections, weight loss, and night sweats. She has not had any headaches, visual changes, dizziness, and falls. No chest pain, heart palpitations, cough and shortness of breath reported. Denies GI problems such as nausea, vomiting, diarrhea, and constipation. She has no reports of blood in stools, dysuria and hematuria. No depression or anxiety reported today. She is taking all medications as prescribed. She denies pain today.   No past surgical history on file.  Family History  Problem Relation Age of Onset  . Diabetes Father   . Esophageal cancer Maternal Grandfather     Social History   Socioeconomic History  . Marital status: Single    Spouse name: Not on file  . Number of children: Not on file  . Years of education: Not on file  . Highest education level: Not on file  Occupational History  . Not on file  Tobacco Use  . Smoking status: Never Smoker  . Smokeless tobacco: Never Used  Substance and Sexual Activity  . Alcohol use: No  . Drug use: No  . Sexual activity: Yes    Birth control/protection: Condom, Pill  Other Topics Concern  . Not on file  Social History Narrative  . Not on file   Social Determinants of  Health   Financial Resource Strain:   . Difficulty of Paying Living Expenses: Not on file  Food Insecurity:   . Worried About Programme researcher, broadcasting/film/video in the Last Year: Not on file  . Ran Out of Food in the Last Year: Not on file  Transportation Needs:   . Lack of Transportation (Medical): Not on file  . Lack of Transportation (Non-Medical): Not on file  Physical Activity:   . Days of Exercise per Week: Not on file  . Minutes of Exercise per Session: Not on file  Stress:   . Feeling of Stress : Not on file  Social Connections:   . Frequency of Communication with Friends and Family: Not on file  . Frequency of Social Gatherings with Friends and Family: Not on file  . Attends Religious Services: Not on file  . Active Member of Clubs or Organizations: Not on file  . Attends Banker Meetings: Not on file  . Marital Status: Not on file  Intimate Partner Violence:   . Fear of Current or Ex-Partner: Not on file  . Emotionally Abused: Not on file  . Physically Abused: Not on file  . Sexually Abused: Not on file    Outpatient Medications Prior to Visit  Medication Sig Dispense Refill  . BLISOVI 24 FE 1-20 MG-MCG(24) tablet TAKE 1 TABLET BY MOUTH EVERY DAY 84 tablet 1  No facility-administered medications prior to visit.    No Known Allergies  ROS Review of Systems  Constitutional: Negative.   HENT: Negative.   Eyes: Negative.   Respiratory: Negative.   Cardiovascular: Negative.   Gastrointestinal: Negative.   Endocrine: Negative.   Genitourinary: Negative.   Musculoskeletal: Negative.   Skin: Negative.   Allergic/Immunologic: Negative.   Neurological: Negative.   Hematological: Negative.   Psychiatric/Behavioral: Negative.       Objective:    Physical Exam Vitals and nursing note reviewed.  Constitutional:      Appearance: Normal appearance.  HENT:     Head: Normocephalic and atraumatic.     Nose: Nose normal.     Mouth/Throat:     Mouth: Mucous  membranes are moist.     Pharynx: Oropharynx is clear.  Cardiovascular:     Rate and Rhythm: Normal rate and regular rhythm.     Pulses: Normal pulses.     Heart sounds: Normal heart sounds.  Pulmonary:     Effort: Pulmonary effort is normal.     Breath sounds: Normal breath sounds.  Abdominal:     General: Bowel sounds are normal.     Palpations: Abdomen is soft.  Musculoskeletal:        General: Normal range of motion.     Cervical back: Normal range of motion and neck supple.  Skin:    General: Skin is warm and dry.  Neurological:     General: No focal deficit present.     Mental Status: She is alert and oriented to person, place, and time.  Psychiatric:        Mood and Affect: Mood normal.        Behavior: Behavior normal.        Thought Content: Thought content normal.        Judgment: Judgment normal.     BP (!) 108/93 (BP Location: Left Arm, Patient Position: Sitting, Cuff Size: Normal)   Pulse 100   Temp 97.8 F (36.6 C)   Resp 18   Ht 5\' 4"  (1.626 m)   Wt 242 lb 9.6 oz (110 kg)   LMP 12/01/2016 (Approximate)   SpO2 100%   BMI 41.64 kg/m  Wt Readings from Last 3 Encounters:  12/02/19 242 lb 9.6 oz (110 kg)  10/08/18 229 lb (103.9 kg)  05/22/17 206 lb (93.4 kg) (98 %, Z= 2.03)*   * Growth percentiles are based on CDC (Girls, 2-20 Years) data.     Health Maintenance Due  Topic Date Due  . CHLAMYDIA SCREENING  Never done  . PAP-Cervical Cytology Screening  Never done  . PAP SMEAR-Modifier  Never done  . INFLUENZA VACCINE  11/10/2019    There are no preventive care reminders to display for this patient.  No results found for: TSH Lab Results  Component Value Date   WBC 7.5 10/08/2018   HGB 13.0 10/08/2018   HCT 38.9 10/08/2018   MCV 86 10/08/2018   PLT 261 10/08/2018   Lab Results  Component Value Date   NA 138 10/08/2018   K 4.3 10/08/2018   CO2 22 10/08/2018   GLUCOSE 107 (H) 10/08/2018   BUN 9 10/08/2018   CREATININE 0.90 10/08/2018    BILITOT 0.4 10/08/2018   ALKPHOS 73 10/08/2018   AST 14 10/08/2018   ALT 9 10/08/2018   PROT 6.7 10/08/2018   ALBUMIN 4.4 10/08/2018   CALCIUM 9.3 10/08/2018   No results found for: CHOL No results found  for: HDL No results found for: LDLCALC No results found for: TRIG No results found for: [facility] Lab Results  Component Value Date   HGBA1C 5.1 01/27/2017      Assessment & Plan:   1. Annual physical exam No abnormalities during annual physical. Recommend monthly self breast exam Recommend daily multivitamin for women Recommend strength training in 150 minutes of cardiovascular exercise per week  2. Amenorrhea She continues to follow up with OB-GYN as needed.   3. Follow up She will follow up as needed.   No orders of the defined types were placed in this encounter.   Orders Placed This Encounter  Procedures  . CBC with Differential  . Comprehensive metabolic panel  . Urinalysis Dipstick    Referral Orders  No referral(s) requested today    Raliegh Ip,  MSN, FNP-BC [facility] [address] [place], [place] [address] [phone]- fax  Problem List Items Addressed This Visit      Other   Amenorrhea    Other Visit Diagnoses    Annual physical exam    -  Primary   Relevant Orders   CBC with Differential   Comprehensive metabolic panel   Urinalysis Dipstick (Completed)   Follow up          No orders of the defined types were placed in this encounter.   Follow-up: No follow-ups on file.    Kallie Locks, FNP

## 2019-12-03 LAB — CBC WITH DIFFERENTIAL/PLATELET
Basophils Absolute: 0 10*3/uL (ref 0.0–0.2)
Basos: 0 %
EOS (ABSOLUTE): 0.2 10*3/uL (ref 0.0–0.4)
Eos: 2 %
Hematocrit: 41.4 % (ref 34.0–46.6)
Hemoglobin: 13.4 g/dL (ref 11.1–15.9)
Immature Grans (Abs): 0 10*3/uL (ref 0.0–0.1)
Immature Granulocytes: 0 %
Lymphocytes Absolute: 2.8 10*3/uL (ref 0.7–3.1)
Lymphs: 32 %
MCH: 27.8 pg (ref 26.6–33.0)
MCHC: 32.4 g/dL (ref 31.5–35.7)
MCV: 86 fL (ref 79–97)
Monocytes Absolute: 0.4 10*3/uL (ref 0.1–0.9)
Monocytes: 5 %
Neutrophils Absolute: 5.3 10*3/uL (ref 1.4–7.0)
Neutrophils: 61 %
Platelets: 297 10*3/uL (ref 150–450)
RBC: 4.82 x10E6/uL (ref 3.77–5.28)
RDW: 12.5 % (ref 11.7–15.4)
WBC: 8.7 10*3/uL (ref 3.4–10.8)

## 2019-12-03 LAB — COMPREHENSIVE METABOLIC PANEL
ALT: 9 IU/L (ref 0–32)
AST: 12 IU/L (ref 0–40)
Albumin/Globulin Ratio: 1.7 (ref 1.2–2.2)
Albumin: 4.4 g/dL (ref 3.9–5.0)
Alkaline Phosphatase: 84 IU/L (ref 48–121)
BUN/Creatinine Ratio: 12 (ref 9–23)
BUN: 10 mg/dL (ref 6–20)
Bilirubin Total: 0.2 mg/dL (ref 0.0–1.2)
CO2: 20 mmol/L (ref 20–29)
Calcium: 9.5 mg/dL (ref 8.7–10.2)
Chloride: 103 mmol/L (ref 96–106)
Creatinine, Ser: 0.86 mg/dL (ref 0.57–1.00)
GFR calc Af Amer: 111 mL/min/{1.73_m2} (ref >59)
GFR calc non Af Amer: 96 mL/min/{1.73_m2} (ref >59)
Globulin, Total: 2.6 g/dL (ref 1.5–4.5)
Glucose: 123 mg/dL — ABNORMAL HIGH (ref 65–99)
Potassium: 4.4 mmol/L (ref 3.5–5.2)
Sodium: 139 mmol/L (ref 134–144)
Total Protein: 7 g/dL (ref 6.0–8.5)

## 2019-12-05 ENCOUNTER — Encounter: Payer: Self-pay | Admitting: Family Medicine

## 2019-12-10 ENCOUNTER — Other Ambulatory Visit: Payer: Self-pay | Admitting: Family Medicine

## 2019-12-10 ENCOUNTER — Telehealth: Payer: Self-pay | Admitting: Family Medicine

## 2019-12-10 DIAGNOSIS — Z309 Encounter for contraceptive management, unspecified: Secondary | ICD-10-CM

## 2019-12-10 MED ORDER — BLISOVI 24 FE 1-20 MG-MCG(24) PO TABS: 1.0000 | ORAL_TABLET | Freq: Every day | ORAL | 3 refills | Status: AC

## 2019-12-11 NOTE — Telephone Encounter (Signed)
Sent to NP 

## 2020-02-19 ENCOUNTER — Telehealth: Payer: Self-pay | Admitting: Family Medicine

## 2020-02-19 NOTE — Telephone Encounter (Signed)
Pt was called concerning AWV. Lvm to call back
